# Patient Record
Sex: Female | Born: 1935 | Race: Black or African American | Hispanic: No | Marital: Married | Smoking: Former smoker
Health system: Southern US, Community
[De-identification: ages and names within clinical notes are randomized; demographics above are authoritative.]

## PROBLEM LIST (undated history)

## (undated) DIAGNOSIS — C801 Malignant (primary) neoplasm, unspecified: Secondary | ICD-10-CM

## (undated) DIAGNOSIS — M1990 Unspecified osteoarthritis, unspecified site: Secondary | ICD-10-CM

## (undated) DIAGNOSIS — J45909 Unspecified asthma, uncomplicated: Secondary | ICD-10-CM

## (undated) DIAGNOSIS — M4800 Spinal stenosis, site unspecified: Secondary | ICD-10-CM

## (undated) HISTORY — PX: COLON SURGERY: SHX602

---

## 1998-04-27 ENCOUNTER — Emergency Department (HOSPITAL_COMMUNITY): Admission: EM | Admit: 1998-04-27 | Discharge: 1998-04-27 | Payer: Self-pay | Admitting: Emergency Medicine

## 1998-08-15 ENCOUNTER — Encounter: Payer: Self-pay | Admitting: Emergency Medicine

## 1998-08-15 ENCOUNTER — Emergency Department (HOSPITAL_COMMUNITY): Admission: EM | Admit: 1998-08-15 | Discharge: 1998-08-15 | Payer: Self-pay | Admitting: Emergency Medicine

## 1999-02-27 ENCOUNTER — Emergency Department (HOSPITAL_COMMUNITY): Admission: EM | Admit: 1999-02-27 | Discharge: 1999-02-27 | Payer: Self-pay | Admitting: Emergency Medicine

## 1999-11-27 ENCOUNTER — Emergency Department (HOSPITAL_COMMUNITY): Admission: EM | Admit: 1999-11-27 | Discharge: 1999-11-27 | Payer: Self-pay | Admitting: Emergency Medicine

## 2007-04-08 ENCOUNTER — Encounter: Payer: Self-pay | Admitting: Emergency Medicine

## 2007-04-08 ENCOUNTER — Encounter (INDEPENDENT_AMBULATORY_CARE_PROVIDER_SITE_OTHER): Payer: Self-pay | Admitting: Emergency Medicine

## 2007-04-09 ENCOUNTER — Inpatient Hospital Stay (HOSPITAL_COMMUNITY): Admission: EM | Admit: 2007-04-09 | Discharge: 2007-04-10 | Payer: Self-pay | Admitting: Internal Medicine

## 2007-04-09 ENCOUNTER — Encounter (INDEPENDENT_AMBULATORY_CARE_PROVIDER_SITE_OTHER): Payer: Self-pay | Admitting: Internal Medicine

## 2011-04-04 NOTE — H&P (Signed)
NAME:  Jill [name], Jill [name] NO.:  [id number]   MEDICAL RECORD NO.:  [id number]          PATIENT TYPE:  EMS   LOCATION:  ED                           FACILITY:  [facility]   PHYSICIAN:  Wilson Singer, M.D.DATE OF BIRTH:  [birth year]   DATE OF ADMISSION:  04/08/2007  DATE OF DISCHARGE:                              HISTORY & PHYSICAL   Primary care physician is Dr. Ivory Broad.   HISTORY:  This is a 77 year old lady who has a history of hypertension,  who now presents with a 45-minute history of left leg weakness and  dragging of that left leg earlier this morning.  She also gives a 1-week  history of bitemporal/frontal headache with bilateral earache.  She does  have a background history of hypertension, as mentioned above.  She  alerted her daughter to her left leg weakness this morning and she was  then brought to the emergency room.  She has had no speech problems or  any other limb weakness problem.   PAST MEDICAL HISTORY:  1. Hypertension.  2. Asthma.  3. History of colon cancer.   PAST SURGICAL HISTORY:  1. Colon resection and hysterectomy about 15 years ago for colon      cancer.  She did not have any other adjuvant treatment such as      radiotherapy or chemotherapy.  2. Cesarean section twice.   SOCIAL HISTORY:  She has been married for 35 years in her second  marriage.  She does not smoke and does not drink alcohol.  She is  retired.   MEDICATIONS:  1. Hyzaar 50/12.5 mg one tablet daily.  2. Singulair 10 mg daily.   ALLERGIES:  CODEINE.   FAMILY HISTORY:  Noncontributory.   REVIEW OF SYSTEMS:  Apart from the symptom mentioned above, there are no  other symptoms referable to all systems reviewed.   PHYSICAL EXAMINATION:  VITAL SIGNS:  Temperature 98.5, blood pressure  130/73, pulse 71, saturation 98%.  CARDIOVASCULAR:  Heart sounds are present and normal without murmurs.  There were no carotid bruits.  Jugular venous pressure is not raised.  RESPIRATORY:  Lung fields are clear with occasional wheezing throughout  lung fields.  ABDOMEN:  Soft and nontender with no hepatosplenomegaly.  NEUROLOGIC:  She is alert and oriented and there are absolutely no focal  neurologic signs at the present time.  In particular, there is no  weakness of the left leg.   INVESTIGATIONS:  CT brain scan showed mild diffuse cerebellar atrophy  but no acute changes.  Sodium 140, potassium 3.7, chloride 105,  bicarbonate 26, glucose 137, BUN 9, creatinine 0.88.  Hemoglobin 13.4,  white blood cell count 4.8, platelets 314.   IMPRESSION:  1. Transient ischemic attack.  2. Hypertension.  3. Asthma.   PLAN:  1. Admit.  2. MRI of the brain with MRA.  Bilateral carotid Dopplers.  3. Aspirin.  4. Neurology consult.  5. Control hypertension.   Further recommendations will depend on the patient's hospital progress.      Wilson Singer, M.D.  Electronically Signed     NCG/MEDQ  D:  04/08/2007  T:  04/09/2007  Job:  [id number]   cc:   Dr. Ivory Broad

## 2011-04-04 NOTE — Consult Note (Signed)
NAME:  Jill, [name] NO.:  [id number]   MEDICAL RECORD NO.:  [id number]          PATIENT TYPE:  INP   LOCATION:  0102                         FACILITY:  [facility]   PHYSICIAN:  Deanna Artis. Hickling, M.D.DATE OF BIRTH:  10/25/1928   DATE OF CONSULTATION:  DATE OF DISCHARGE:                                 CONSULTATION   CHIEF COMPLAINT:  Right brain TIA.   HISTORY OF PRESENT CONDITION:  [name] is a 77 year old woman  with a longstanding history of hypertension and no prior history of  stroke.  The patient denies risk factors for stroke including diabetes,  dyslipidemia, heart disease, smoking, or a family history of stroke.   Today she awakened, had her breakfast and as she was getting up she  noted that she was dragging her left foot.  She sat back down and tried  it again, and again was unsteady on her foot.  No she felt weak all  over, something that she had experienced a couple of weeks before  without the localized abnormality.  She had seen Dr. Ivory Broad who had noted  that she had hypertension at that time.  He adjusted her medication.  There are days when she takes it when she feels very lightheaded, other  days when she does not have symptoms from it.   The patient, as best she knows, has been well-hydrated.   REVIEW OF SYSTEMS:  The patient has not had any fever, runny nose,  cough, shortness of breath, chest pain, dysuria, hematuria, nausea,  vomiting, diarrhea, constipation.  She does have arthritis.  She has had  no problems with diabetes or thyroid disease.  She has no known  allergies.  No fractures or sprains.  No depression or anxiety.  She has  not had diplopia, dysarthria, dysphagia, tinnitus, syncope, vertigo,  weakness, numbness, tingling, or loss of bowel and bladder control.  A  12-system review is otherwise negative.   CURRENT MEDICATIONS:  Hyzaar 50 mg daily, Singulair 5 mg daily, Flex  Support 1daily, multivitamin daily, and a  breathing vitamin.  I do not  know what that means.   DRUG ALLERGIES:  CODEINE.   FAMILY HISTORY:  Negative for strokes.  Positive for hypertension and  heart disease.  Her daughter has breast cancer.   SOCIAL HISTORY:  The patient is widowed.  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  She went to  nursing school but did not completed it.  She has traveled back to her  home in [place] and is now her daughter is living with her, and  she provides care for her daughter.  She is fully independent in all of  her activities of daily living.   On arrival at [facility], no definite abnormalities were seen.  CT scan of the brain showed atrophy.  I recommended an MRI scan of the  brain which showed evidence of diffuse subcortical white matter disease  and pontine white matter disease.  I recommended also a 2-D  echocardiogram and carotid Doppler which have not been done.  I told  the  ER physician that the patient was transferred to [facility]  that I would admit the patient, and if not that we would consult.  The  patient remains at [facility] and I have come to consult.  Dr. Karilyn Cota  has admitted the patient earlier today.   Other laboratory studies include sodium 140, potassium 3.7, chloride  105, bicarbonate 26, glucose 137, BUN 9, creatinine 0.8, hemoglobin  13.4, white count 4800, platelet count 314,000.  EKG showed a normal  sinus rhythm, possible anterior infarct of undetermined age.  This was  the only change from a November 27, 1999 EKG.   PHYSICAL EXAMINATION:  GENERAL:  Today this is a well-developed, well-  nourished woman in no acute distress.  Temperature 97.9, blood pressure 120/60, resting pulse 73, respirations  16, oxygen saturation 97%.  EARS, NOSE AND THROAT:  No infection.  Supple neck.  No bruits.  LUNGS:  Clear.  HEART:  No murmurs.  Pulses normal.  ABDOMEN:  Soft.  Bowel sounds normal.  No  hepatosplenomegaly.  EXTREMITIES:  Normal.  NEUROLOGIC EXAMINATION:  Awake, alert.  No dysphasia, dyspraxia.  Cranial nerves round and reactive pupils.  Fundi normal.  Visual fields  full to double simultaneous stimuli.  Symmetric facial strength.  Midline tongue and uvula.  Air conduction greater than bone conduction  bilaterally.  MOTOR EXAMINATION:  Normal strength.  Fine motor movements were normal.  Sensory examination shows a mild peripheral neuropathy.  Good  stereognosis.  Deep tendon reflexes are diminished to absent.  The  patient had bilateral flexor plantar responses.  Her gait is stable with  no hemiparetic gait.   IMPRESSION:  1. Transient ischemic attack right brain, 435.8.  2. Diffuse subcortical and pontine white matter disease.  3. Cortical atrophy.  4. No acute infarction.  5. Diffuse intracranial large vessel disease without occlusion.  6. Hypertension 404.10.   PLAN:  Admit to the [facility] service.  I will follow her unless she is  transferred to [facility], in which the stroke service will follow her.  She needs a 2-D echocardiogram, carotid Doppler as an inpatient,  transcranial Doppler as an inpatient at [facility] or as an outpatient if not.  Enteric-coated aspirin 325 mg daily.  Morning laboratories: fasting  lipid panel, hemoglobin A1c, and serum homocystine.  Risk factors for  stroke should be treated.      Deanna Artis. Sharene Skeans, M.D.  Electronically Signed     WHH/MEDQ  D:  04/08/2007  T:  04/09/2007  Job:  [id number]   cc:   Nolon Nations, MD  [place], [place]   Wilson Singer, M.D.  Fax: [phone]

## 2016-08-20 ENCOUNTER — Emergency Department (HOSPITAL_COMMUNITY): Payer: Medicare Other

## 2016-08-20 ENCOUNTER — Emergency Department (HOSPITAL_COMMUNITY)
Admission: EM | Admit: 2016-08-20 | Discharge: 2016-08-20 | Disposition: A | Discharge status: Home / Self Care | Payer: Medicare Other | Attending: Emergency Medicine | Admitting: Emergency Medicine

## 2016-08-20 ENCOUNTER — Encounter (HOSPITAL_COMMUNITY): Payer: Self-pay

## 2016-08-20 DIAGNOSIS — J45909 Unspecified asthma, uncomplicated: Secondary | ICD-10-CM | POA: Insufficient documentation

## 2016-08-20 DIAGNOSIS — Z87891 Personal history of nicotine dependence: Secondary | ICD-10-CM | POA: Diagnosis not present

## 2016-08-20 DIAGNOSIS — Z85038 Personal history of other malignant neoplasm of large intestine: Secondary | ICD-10-CM | POA: Insufficient documentation

## 2016-08-20 DIAGNOSIS — R05 Cough: Secondary | ICD-10-CM | POA: Insufficient documentation

## 2016-08-20 DIAGNOSIS — Z7982 Long term (current) use of aspirin: Secondary | ICD-10-CM | POA: Diagnosis not present

## 2016-08-20 DIAGNOSIS — R059 Cough, unspecified: Secondary | ICD-10-CM

## 2016-08-20 HISTORY — DX: Unspecified osteoarthritis, unspecified site: M19.90

## 2016-08-20 HISTORY — DX: Spinal stenosis, site unspecified: M48.00

## 2016-08-20 HISTORY — DX: Malignant (primary) neoplasm, unspecified: C80.1

## 2016-08-20 HISTORY — DX: Unspecified asthma, uncomplicated: J45.909

## 2016-08-20 LAB — CBC WITH DIFFERENTIAL/PLATELET
Basophils Absolute: 0.1 10*3/uL (ref 0.0–0.1)
Basophils Relative: 1 %
EOS PCT: 4 %
Eosinophils Absolute: 0.3 10*3/uL (ref 0.0–0.7)
HEMATOCRIT: 39.6 % (ref 36.0–46.0)
Hemoglobin: 13.3 g/dL (ref 12.0–15.0)
LYMPHS ABS: 3.1 10*3/uL (ref 0.7–4.0)
LYMPHS PCT: 36 %
MCH: 28.5 pg (ref 26.0–34.0)
MCHC: 33.6 g/dL (ref 30.0–36.0)
MCV: 85 fL (ref 78.0–100.0)
MONO ABS: 0.6 10*3/uL (ref 0.1–1.0)
Monocytes Relative: 7 %
NEUTROS ABS: 4.4 10*3/uL (ref 1.7–7.7)
Neutrophils Relative %: 52 %
PLATELETS: 347 10*3/uL (ref 150–400)
RBC: 4.66 MIL/uL (ref 3.87–5.11)
RDW: 15.9 % — AB (ref 11.5–15.5)
WBC: 8.4 10*3/uL (ref 4.0–10.5)

## 2016-08-20 LAB — BASIC METABOLIC PANEL
Anion gap: 9 (ref 5–15)
BUN: 7 mg/dL (ref 6–20)
CHLORIDE: 105 mmol/L (ref 101–111)
CO2: 25 mmol/L (ref 22–32)
Calcium: 9.3 mg/dL (ref 8.9–10.3)
Creatinine, Ser: 0.69 mg/dL (ref 0.44–1.00)
GFR calc Af Amer: >60 mL/min (ref >60)
GFR calc non Af Amer: >60 mL/min (ref >60)
GLUCOSE: 99 mg/dL (ref 65–99)
POTASSIUM: 3.9 mmol/L (ref 3.5–5.1)
Sodium: 139 mmol/L (ref 135–145)

## 2016-08-20 MED ORDER — PREDNISONE 10 MG PO TABS: 40.0000 mg | ORAL_TABLET | Freq: Every day | ORAL | 0 refills | Status: AC

## 2016-08-20 MED ORDER — ALBUTEROL SULFATE HFA 108 (90 BASE) MCG/ACT IN AERS: 2.0000 | INHALATION_SPRAY | RESPIRATORY_TRACT | 1 refills | Status: AC | PRN

## 2016-08-20 MED ORDER — IPRATROPIUM-ALBUTEROL 0.5-2.5 (3) MG/3ML IN SOLN
3.0000 mL | Freq: Once | RESPIRATORY_TRACT | Status: AC
  Administered 2016-08-20: 3 mL via RESPIRATORY_TRACT
  Filled 2016-08-20: qty 3

## 2016-08-20 MED ORDER — PREDNISONE 20 MG PO TABS
40.0000 mg | ORAL_TABLET | Freq: Once | ORAL | Status: AC
  Administered 2016-08-20: 40 mg via ORAL
  Filled 2016-08-20: qty 2

## 2016-08-20 MED ORDER — BENZONATATE 100 MG PO CAPS: 100.0000 mg | ORAL_CAPSULE | Freq: Three times a day (TID) | ORAL | 0 refills | Status: AC

## 2016-08-20 NOTE — Discharge Instructions (Signed)
Follow-up with your primary care provider and the pulmonologist this week to be seen as soon as possible for follow-up. Take the prednisone as prescribed. Use your inhaler as needed for wheezing and cough.  Return to the emergency department if you experience chest pain, difficulty breathing, shortness of breath, coughing up bloody sputum, fever, headaches, or any other concerning symptoms.

## 2016-08-20 NOTE — ED Triage Notes (Signed)
Pt c/o increasing productive cough and congestion x 1 month.  Denies new pain.  Pt has been taking OTC medications w/o relief.  NAD noted.  Pt easily speaking full sentences.

## 2016-08-20 NOTE — ED Notes (Signed)
Pt has had a cough for the last month but last night she was coughing so bad her daughter wanted to bring her in to make sure it wasn't pneumonia.

## 2016-08-20 NOTE — ED Provider Notes (Signed)
[facility] DEPT Provider Note   CSN: [id number] Arrival date & time: 08/20/16  1018     History   Chief Complaint Chief Complaint  Patient presents with  . Cough  . Chest Congestion    HPI Jill [name] is a 82 y.o. female.  HPI   Patient Is an 82 year old female with history of asthma, colon cancer, chronic back pain who presents the department with progressively worsening cough for 1 month. Productive cough with clear mucus worsening in the last week. The cough kept the pt from sleeping last night. She has tried her albuterol inhaler which helps a little. She is also tried allergy medicine and cough drops without relief. Patient is not a smoker. Associated symptoms cold/hot intervals. No chest pain or shortness of breath and wheezing. Patient denies abdominal pain, chest pain, headache, dizziness, syncope, fever.  Past Medical History:  Diagnosis Date  . Arthritis   . Asthma   . Cancer ([place])    colon  . Spinal stenosis     There are no active problems to display for this patient.   Past Surgical History:  Procedure Laterality Date  . CESAREAN SECTION    . COLON SURGERY      OB History    No data available       Home Medications    Prior to Admission medications   Medication Sig Start Date End Date Taking? Authorizing Provider  amLODipine (NORVASC) 10 MG tablet Take 10 mg by mouth daily.   Yes Historical Provider, MD  aspirin buffered ([employer] BUFFERED ASPIRIN) 325 MG TABS tablet Take 325 mg by mouth daily.    Yes Historical Provider, MD  Cetirizine HCl 10 MG CAPS Take 10 mg by mouth daily.  06/19/11  Yes Historical Provider, MD  Cholecalciferol (VITAMIN D-1000 MAX ST) 1000 units tablet Take 1,000 Units by mouth daily.    Yes Historical Provider, MD  Flaxseed, Linseed, (FLAXSEED OIL MAX STR) 1300 MG CAPS Take 1,300 mg by mouth daily.    Yes Historical Provider, MD  gabapentin (NEURONTIN) 300 MG capsule Take 300 mg by mouth 3 (three) times daily.   02/23/16  Yes Historical Provider, MD  hydroxypropyl methylcellulose / hypromellose (ISOPTO TEARS / GONIOVISC) 2.5 % ophthalmic solution Place 1 drop into both eyes 3 (three) times daily as needed for dry eyes.   Yes Historical Provider, MD  ibuprofen (ADVIL,MOTRIN) 200 MG tablet Take 600 mg by mouth every 6 (six) hours as needed for moderate pain.   Yes Historical Provider, MD  losartan (COZAAR) 100 MG tablet Take 100 mg by mouth daily.   Yes Historical Provider, MD  Multiple Vitamin (MULTIVITAMIN WITH MINERALS) TABS tablet Take 1 tablet by mouth daily.   Yes Historical Provider, MD  OVER THE COUNTER MEDICATION Take 1 capsule by mouth daily. Lung Supplement   Yes Historical Provider, MD  OVER THE COUNTER MEDICATION Take 1 capsule by mouth daily. Flex Support Supplement   Yes Historical Provider, MD  vitamin C (ASCORBIC ACID) 500 MG tablet Take 1,000 mg by mouth daily.    Yes Historical Provider, MD  albuterol (PROVENTIL HFA;VENTOLIN HFA) 108 (90 Base) MCG/ACT inhaler Inhale 2 puffs into the lungs every 4 (four) hours as needed for wheezing or shortness of breath. 08/20/16   Kalman Drape, PA  benzonatate (TESSALON) 100 MG capsule Take 1 capsule (100 mg total) by mouth every 8 (eight) hours. 08/20/16   Kalman Drape, PA  predniSONE (DELTASONE) 10 MG tablet Take 4 tablets (40  mg total) by mouth daily. 08/20/16 08/24/16  Kalman Drape, PA    Family History History reviewed. No pertinent family history.  Social History Social History  Substance Use Topics  . Smoking status: Former Research scientist (life sciences)  . Smokeless tobacco: Never Used  . Alcohol use No     Allergies   Morphine   Review of Systems Review of Systems  Constitutional: Positive for chills. Negative for fever.  HENT: Negative for congestion, postnasal drip, rhinorrhea, sore throat and trouble swallowing.   Eyes: Negative for visual disturbance.  Respiratory: Positive for cough. Negative for shortness of breath.   Cardiovascular: Negative  for chest pain.  Gastrointestinal: Negative for abdominal pain, nausea and vomiting.  Genitourinary: Negative for dysuria and hematuria.  Musculoskeletal: Positive for back pain (chronic).  Skin: Negative for rash.  Neurological: Negative for dizziness, syncope and headaches.     Physical Exam Updated Vital Signs BP 147/96 (BP Location: Right Arm)   Pulse 99   Temp 98.2 F (36.8 C) (Oral)   Ht 4\' 9"  (1.448 m)   Wt 68.9 kg   SpO2 100%   BMI 32.89 kg/m   Physical Exam  Constitutional: She appears well-developed and well-nourished. No distress.  HENT:  Head: Normocephalic and atraumatic.  Mouth/Throat: Uvula is midline and mucous membranes are normal. Posterior oropharyngeal erythema present. No oropharyngeal exudate, posterior oropharyngeal edema or tonsillar abscesses.  Eyes: Conjunctivae are normal.  Cardiovascular: Normal rate, regular rhythm and normal heart sounds.  Exam reveals no gallop and no friction rub.   No murmur heard. Pulses:      Radial pulses are 2+ on the right side, and 2+ on the left side.  Pulmonary/Chest: Effort normal and breath sounds normal. No respiratory distress. She has no decreased breath sounds. She has no wheezes. She has no rhonchi. She has no rales.  Abdominal: Soft. There is no tenderness.  Musculoskeletal: Normal range of motion. She exhibits no edema.  Neurological: She is alert. Coordination normal.  Skin: Skin is warm and dry. She is not diaphoretic.  Psychiatric: She has a normal mood and affect. Her behavior is normal.  Nursing note and vitals reviewed.    ED Treatments / Results  Labs (all labs ordered are listed, but only abnormal results are displayed) Labs Reviewed  CBC WITH DIFFERENTIAL/PLATELET - Abnormal; Notable for the following:       Result Value   RDW 15.9 (*)    All other components within normal limits  BASIC METABOLIC PANEL    EKG  EKG Interpretation None       Radiology Dg Chest 2 View  Result Date:  08/20/2016 CLINICAL DATA:  Productive cough x approx. 1 month, worsening last p.m. H/o Asthma. Denies chest pain. EXAM: CHEST  2 VIEW COMPARISON:  None. FINDINGS: Lordotic exam. Normal cardiac silhouette. No effusion, infiltrate or pneumothorax. Degenerate changes of the shoulders. IMPRESSION: No acute cardiopulmonary process. Electronically Signed   By: Suzy Bouchard M.D.   On: 08/20/2016 12:07    Procedures Procedures (including critical care time)  Medications Ordered in ED Medications  ipratropium-albuterol (DUONEB) 0.5-2.5 (3) MG/3ML nebulizer solution 3 mL (3 mLs Nebulization Given 08/20/16 1228)  predniSONE (DELTASONE) tablet 40 mg (40 mg Oral Given 08/20/16 1307)     Initial Impression / Assessment and Plan / ED Course  I have reviewed the triage vital signs and the nursing notes.  Pertinent labs & imaging results that were available during my care of the patient were reviewed by me and  considered in my medical decision making (see chart for details).  Clinical Course   12:00 PM PT states the DUO neb caused her to cough more. Discussed out patient f/u with pulmonology. Pt amenable to plan.  Pt with cough, afebrile, lungs CTA, no wheezes, labs unremarkable, no leukocytosis. Chest x-ray reviewed by me revealed no infiltrates or acute abnormalities. Patient's cough is likely 2/2 her asthma and allergies and less likely an infectious process such as pneumonia. Patient was given neb treatment in the ED which caused increased coughing. Patient will be discharged with a short course of prednisone and albuterol inhaler. Instructed patient to follow-up with her PCP and pulmonologist this week to be reevaluated. Discussed strict return precautions to the ED. I discussed all of the results with the patient and family members and they have expressed their understanding to the verbal discharge instructions.  Pt case discussed and pt seen by Dr. Venora Maples who agrees with the above plan.   Final  Clinical Impressions(s) / ED Diagnoses   Final diagnoses:  Cough    New Prescriptions Discharge Medication List as of 08/20/2016  1:56 PM    START taking these medications   Details  benzonatate (TESSALON) 100 MG capsule Take 1 capsule (100 mg total) by mouth every 8 (eight) hours., Starting Sun 08/20/2016, Print    predniSONE (DELTASONE) 10 MG tablet Take 4 tablets (40 mg total) by mouth daily., Starting Sun 08/20/2016, Until Thu 08/24/2016, [place], PA 08/20/16 1517    Jola Schmidt, MD 08/20/16 [phone]

## 2016-08-20 NOTE — ED Notes (Signed)
Bed: WA02 Expected date:  Expected time:  Means of arrival:  Comments: 

## 2018-02-18 ENCOUNTER — Other Ambulatory Visit: Payer: Self-pay

## 2018-02-18 ENCOUNTER — Emergency Department (HOSPITAL_COMMUNITY)
Admission: EM | Admit: 2018-02-18 | Discharge: 2018-02-18 | Disposition: A | Discharge status: Home / Self Care | Payer: Medicare Other | Attending: Emergency Medicine | Admitting: Emergency Medicine

## 2018-02-18 ENCOUNTER — Emergency Department (HOSPITAL_COMMUNITY): Payer: Medicare Other

## 2018-02-18 ENCOUNTER — Encounter (HOSPITAL_COMMUNITY): Payer: Self-pay

## 2018-02-18 DIAGNOSIS — Y92000 Kitchen of unspecified non-institutional (private) residence as  the place of occurrence of the external cause: Secondary | ICD-10-CM | POA: Diagnosis not present

## 2018-02-18 DIAGNOSIS — Y999 Unspecified external cause status: Secondary | ICD-10-CM | POA: Diagnosis not present

## 2018-02-18 DIAGNOSIS — Z79899 Other long term (current) drug therapy: Secondary | ICD-10-CM | POA: Diagnosis not present

## 2018-02-18 DIAGNOSIS — R112 Nausea with vomiting, unspecified: Secondary | ICD-10-CM | POA: Diagnosis not present

## 2018-02-18 DIAGNOSIS — Z85038 Personal history of other malignant neoplasm of large intestine: Secondary | ICD-10-CM | POA: Diagnosis not present

## 2018-02-18 DIAGNOSIS — S4992XA Unspecified injury of left shoulder and upper arm, initial encounter: Secondary | ICD-10-CM | POA: Diagnosis present

## 2018-02-18 DIAGNOSIS — W1830XA Fall on same level, unspecified, initial encounter: Secondary | ICD-10-CM | POA: Diagnosis not present

## 2018-02-18 DIAGNOSIS — M25512 Pain in left shoulder: Secondary | ICD-10-CM | POA: Diagnosis not present

## 2018-02-18 DIAGNOSIS — Z87891 Personal history of nicotine dependence: Secondary | ICD-10-CM | POA: Insufficient documentation

## 2018-02-18 DIAGNOSIS — Y939 Activity, unspecified: Secondary | ICD-10-CM | POA: Insufficient documentation

## 2018-02-18 MED ORDER — ACETAMINOPHEN 325 MG PO TABS: 650.0000 mg | ORAL_TABLET | Freq: Four times a day (QID) | ORAL | 0 refills | Status: AC | PRN

## 2018-02-18 MED ORDER — ONDANSETRON HCL 4 MG PO TABS: 4.0000 mg | ORAL_TABLET | Freq: Three times a day (TID) | ORAL | 0 refills | Status: AC | PRN

## 2018-02-18 MED ORDER — ACETAMINOPHEN 325 MG PO TABS
650.0000 mg | ORAL_TABLET | Freq: Once | ORAL | Status: AC
  Administered 2018-02-18: 650 mg via ORAL
  Filled 2018-02-18: qty 2

## 2018-02-18 NOTE — ED Provider Notes (Signed)
[facility] DEPT Provider Note   CSN: [id number] Arrival date & time: 02/18/18  1201     History   Chief Complaint Chief Complaint  Patient presents with  . Fall  . Shoulder Pain  . Emesis    HPI Jill [name] is a 84 y.o. female.  HPI   Patient is an 84 year old female with a h/o spinal stenosis and HTN who presents the ED today complaining of 8/10 constant left-sided shoulder pain that began yesterday. Pt states she fell 2 days ago onto the left shoulder after she tripped over the door of the dishwasher. Was unable to catch herself with her hands. States fall was purely mechanical and she had No prodrome of chest pain, shortness of breath, lightheadedness prior to the fall. Denies that she hit her head or loss consciousness.  Denies any neck pain, back pain, abdominal pain.  Denies any headaches, vision changes, lightheadedness, weakness, numbness.  Denies any chest pain, palpitations, or shortness of breath currently.  States she takes gabapentin for pain which has not improved her symptoms.  Also tried taking ibuprofen this morning on empty stomach, this led to 2 episodes of vomiting shortly after taking ibuprofen.  Has had no continued episodes of vomiting and denies any nausea currently.  Denies any other injuries or pain since the fall.  Daughter at bedside states that pt is at baseline and has not been confused or had any changes in mental status recently. Denies that pt has had any other complaints at home.  Past Medical History:  Diagnosis Date  . Arthritis   . Asthma   . Cancer ([place])    colon  . Spinal stenosis     There are no active problems to display for this patient.   Past Surgical History:  Procedure Laterality Date  . CESAREAN SECTION    . COLON SURGERY       OB History   None      Home Medications    Prior to Admission medications   Medication Sig Start Date End Date Taking? Authorizing Provider    albuterol (PROVENTIL HFA;VENTOLIN HFA) 108 (90 Base) MCG/ACT inhaler Inhale 2 puffs into the lungs every 4 (four) hours as needed for wheezing or shortness of breath. 08/20/16  Yes Focht, Jessica L, PA  amLODipine (NORVASC) 10 MG tablet Take 10 mg by mouth daily.   Yes [provider]  aspirin buffered ([employer] BUFFERED ASPIRIN) 325 MG TABS tablet Take 325 mg by mouth daily.    Yes [provider]  Cetirizine HCl 10 MG CAPS Take 10 mg by mouth daily.  06/19/11  Yes [provider]  Cholecalciferol (VITAMIN D-1000 MAX ST) 1000 units tablet Take 1,000 Units by mouth daily.    Yes [provider]  Flaxseed, Linseed, (FLAXSEED OIL MAX STR) 1300 MG CAPS Take 1,300 mg by mouth daily.    Yes [provider]  gabapentin (NEURONTIN) 300 MG capsule Take 300 mg by mouth 2 (two) times daily.  02/23/16  Yes [provider]  hydroxypropyl methylcellulose / hypromellose (ISOPTO TEARS / GONIOVISC) 2.5 % ophthalmic solution Place 1 drop into both eyes at bedtime.    Yes [provider]  ibuprofen (ADVIL,MOTRIN) 200 MG tablet Take 600 mg by mouth every 6 (six) hours as needed for moderate pain.   Yes [provider]  losartan (COZAAR) 100 MG tablet Take 100 mg by mouth daily.   Yes [provider]  Multiple Vitamin (MULTIVITAMIN WITH MINERALS)  TABS tablet Take 1 tablet by mouth daily.   Yes [provider]  OVER THE COUNTER MEDICATION Take 1 capsule by mouth daily. Lung Supplement   Yes [provider]  OVER THE COUNTER MEDICATION Take 1 capsule by mouth daily. Flex Support Supplement   Yes [provider]  vitamin C (ASCORBIC ACID) 500 MG tablet Take 1,000 mg by mouth daily.    Yes [provider]  acetaminophen (TYLENOL) 325 MG tablet Take 2 tablets (650 mg total) by mouth every 6 (six) hours as needed. Do not take more than 4000mg  of tylenol per day 02/18/18   Sherise Geerdes S, PA-C  benzonatate (TESSALON)  100 MG capsule Take 1 capsule (100 mg total) by mouth every 8 (eight) hours. Patient not taking: Reported on 02/18/2018 08/20/16   Jackson Latino L, PA  ondansetron (ZOFRAN) 4 MG tablet Take 1 tablet (4 mg total) by mouth every 8 (eight) hours as needed for nausea or vomiting. 02/18/18   Coleta Grosshans S, PA-C    Family History No family history on file.  Social History Social History   Tobacco Use  . Smoking status: Former Research scientist (life sciences)  . Smokeless tobacco: Never Used  Substance Use Topics  . Alcohol use: No  . Drug use: No     Allergies   Morphine   Review of Systems Review of Systems  Constitutional: Negative for chills and fever.  HENT: Negative for congestion and ear pain.   Eyes: Negative for pain and visual disturbance.  Respiratory: Negative for shortness of breath.   Cardiovascular: Negative for chest pain, palpitations and leg swelling.  Gastrointestinal: Positive for vomiting. Negative for abdominal pain, constipation, diarrhea and nausea.  Genitourinary: Negative for dysuria, flank pain, frequency, hematuria and urgency.  Musculoskeletal: Negative for back pain and neck pain.       Left shoulder pain  Skin: Negative for rash.  Neurological: Negative for dizziness, syncope, weakness, light-headedness, numbness and headaches.  All other systems reviewed and are negative.    Physical Exam Updated Vital Signs BP (!) 165/82 (BP Location: Right Arm)   Pulse (!) 114   Temp 98.3 F (36.8 C) (Oral)   Resp 20   Ht 4\' 3"  (1.295 m)   Wt 70.3 kg (155 lb)   SpO2 98%   BMI 41.90 kg/m   Physical Exam  Constitutional: She is oriented to person, place, and time. She appears well-developed and well-nourished. No distress.  HENT:  Head: Normocephalic and atraumatic.  Eyes: Conjunctivae are normal.  Neck: Neck supple.  Cardiovascular: Normal rate and regular rhythm.  No murmur heard. Pulmonary/Chest: Effort normal and breath sounds normal. No respiratory distress.    Abdominal: Soft. There is no tenderness.  Musculoskeletal: She exhibits no edema.  Decreased strength to left upper extremity due to pain. 5/5 grip strength, flexion/extension at elbow. Decreased ability to flex, abduct LUE due to pain. TTP diffusely to the left shoulder and proximal humerus. No erythema, warmth or swelling. Radial pulses intact. Sensation intact bilat and symmetric. No TTP to the cervical, thoracic, or lumbar spine. No pain to the paraspinous muscles.  Neurological: She is alert and oriented to person, place, and time.  Skin: Skin is warm and dry. Capillary refill takes less than 2 seconds.  Psychiatric: She has a normal mood and affect.  Nursing note and vitals reviewed.   ED Treatments / Results  Labs (all labs ordered are listed, but only abnormal results are displayed) Labs Reviewed - No data to display  EKG None  Radiology Dg Shoulder Left  Result Date: 02/18/2018 CLINICAL DATA:  Tripped in the home 2 days ago with left shoulder pain. EXAM: LEFT SHOULDER - 2+ VIEW COMPARISON:  None. FINDINGS: There is osteoarthritis of the glenohumeral joint but the humeral head is normally located. There is osteoarthritis of the [facility] joint with inferior osteophytes. There is narrowing of the humeral acromial distance. The patient certainly could have acute or chronic rotator cuff pathology. IMPRESSION: No acute radiographic finding. Chronic degenerative changes of the shoulder joint and AC joint. There could be chronic and/or acute rotator cuff pathology. Electronically Signed   By: Nelson Chimes M.D.   On: 02/18/2018 14:06    Procedures Procedures (including critical care time)  Medications Ordered in ED Medications  acetaminophen (TYLENOL) tablet 650 mg (650 mg Oral Given 02/18/18 1502)     Initial Impression / Assessment and Plan / ED Course  I have reviewed the triage vital signs and the nursing notes.  Pertinent labs & imaging results that were available during my care of  the patient were reviewed by me and considered in my medical decision making (see chart for details).    Discussed pt presentation and exam findings with Dr. Lacinda Axon, who agrees with the plan to d/c pt with outpt f/u.   Final Clinical Impressions(s) / ED Diagnoses   Final diagnoses:  Acute pain of left shoulder  Non-intractable vomiting with nausea, unspecified vomiting type   Patient with left shoulder pain after fall 2 days ago.  X-ray negative for acute fracture or dislocation. X-rays suggest that there could be acute or chronic rotator cuff pathology. No evidence of proximal humerus fracture.  Patient is neurovascularly intact.  She does have some decreased strength due to pain. Pain reproducible on exam. No evidence of septic joint.   Advised patient's family to have patient treat her pain with Tylenol, ice, heat, and have her follow-up with orthopedics for further evaluation of her pain as this may be a rotator cuff injury.  Patient denies head trauma or LOC. Daughter states she was present and nearby during fall and also denies head trauma.  Patient is not anticoagulated.  No indication for head ct at this time. Pt stable for d/c with outpt f/u.   Pt htn in the ED. She did not take her BP medications today. Doubt htn emergency/urgency as pt is asx. Advised to take bp medications at home. Pt also has some tachycardia. No chest pain or SOB. No LE swelling, erythema, or pain. Does not appear grossly dehydrated on exam, but may have some mild dehydration given h/o vomiting x2 today. Also appears that pt was tachycardic during her last visit in the ED. She does not appear to have any source of infection and I do not suspect any emergent underlying pathology to cause this. Advised her to push fluids at home and gave rx for zofran at home. Suspect episodes of vomiting earlier due to taking ibuprofen on empty stomach, no continued episodes of vomiting.  Pt safe for dispo with outpt f/u with ortho and  PCP. Gave strict return precautions for any new or worsening sxs. All questions answered and pt and her daughter understand plan.  ED Discharge Orders        Ordered    ondansetron (ZOFRAN) 4 MG tablet  Every 8 hours PRN     02/18/18 1508    acetaminophen (TYLENOL) 325 MG tablet  Every 6 hours PRN     02/18/18 1508  Rodney Booze, PA-C 02/19/18 0035    Nat Christen, MD 02/22/18 1339

## 2018-02-18 NOTE — Discharge Instructions (Signed)
The xray of your left shoulder did not show any evidence of a broken bone or dislocated shoulder.  You were given a referral to follow-up with the orthopedic doctor for further treatment of your left shoulder pain.  You will need to call the office to schedule an appointment.  You may take Tylenol and over the counter pain medicines for your symptoms.  You may also use cold or warm compresses to help ease your pain.  I have attached shoulder range of motion exercises as well.  You were also given a prescription for Zofran to help with any nausea that you may have.  Make sure that you are drinking lots of fluids and staying hydrated.  Please return to the emergency department for any new or worsening symptoms including any chest pain, shortness of breath, headaches, or any new or worsening symptoms.

## 2018-02-18 NOTE — ED Triage Notes (Addendum)
Patient tripped on the dishwasher drawer 2 days ago, landing on her left shoulder. Patient did not hit her head or have LOC. Patient had vomiting x 2 this AM.

## 2019-04-20 ENCOUNTER — Emergency Department (HOSPITAL_BASED_OUTPATIENT_CLINIC_OR_DEPARTMENT_OTHER)
Admission: EM | Admit: 2019-04-20 | Discharge: 2019-04-20 | Disposition: A | Discharge status: Home / Self Care | Payer: Medicare Other | Attending: Emergency Medicine | Admitting: Emergency Medicine

## 2019-04-20 ENCOUNTER — Emergency Department (HOSPITAL_BASED_OUTPATIENT_CLINIC_OR_DEPARTMENT_OTHER): Payer: Medicare Other

## 2019-04-20 ENCOUNTER — Encounter (HOSPITAL_BASED_OUTPATIENT_CLINIC_OR_DEPARTMENT_OTHER): Payer: Self-pay | Admitting: Emergency Medicine

## 2019-04-20 ENCOUNTER — Other Ambulatory Visit: Payer: Self-pay

## 2019-04-20 DIAGNOSIS — W06XXXA Fall from bed, initial encounter: Secondary | ICD-10-CM | POA: Insufficient documentation

## 2019-04-20 DIAGNOSIS — M25512 Pain in left shoulder: Secondary | ICD-10-CM | POA: Diagnosis not present

## 2019-04-20 DIAGNOSIS — M542 Cervicalgia: Secondary | ICD-10-CM | POA: Insufficient documentation

## 2019-04-20 DIAGNOSIS — Y929 Unspecified place or not applicable: Secondary | ICD-10-CM | POA: Insufficient documentation

## 2019-04-20 DIAGNOSIS — J45909 Unspecified asthma, uncomplicated: Secondary | ICD-10-CM | POA: Diagnosis not present

## 2019-04-20 DIAGNOSIS — Z87891 Personal history of nicotine dependence: Secondary | ICD-10-CM | POA: Diagnosis not present

## 2019-04-20 DIAGNOSIS — Y999 Unspecified external cause status: Secondary | ICD-10-CM | POA: Insufficient documentation

## 2019-04-20 DIAGNOSIS — S0990XA Unspecified injury of head, initial encounter: Secondary | ICD-10-CM | POA: Diagnosis present

## 2019-04-20 DIAGNOSIS — Z79899 Other long term (current) drug therapy: Secondary | ICD-10-CM | POA: Insufficient documentation

## 2019-04-20 DIAGNOSIS — Z7982 Long term (current) use of aspirin: Secondary | ICD-10-CM | POA: Diagnosis not present

## 2019-04-20 DIAGNOSIS — Y939 Activity, unspecified: Secondary | ICD-10-CM | POA: Insufficient documentation

## 2019-04-20 DIAGNOSIS — W19XXXA Unspecified fall, initial encounter: Secondary | ICD-10-CM

## 2019-04-20 MED ORDER — ACETAMINOPHEN 500 MG PO TABS
1000.0000 mg | ORAL_TABLET | Freq: Once | ORAL | Status: AC
  Administered 2019-04-20: 05:00:00 1000 mg via ORAL
  Filled 2019-04-20: qty 2

## 2019-04-20 NOTE — ED Notes (Signed)
Called Jill [name] and requested transport for patient back to her residence.  Was advised they would have someone out as soon as they could.

## 2019-04-20 NOTE — Discharge Instructions (Addendum)
You may take Tylenol 1000 mg every 6 hours as needed for pain.  CT of your head, neck and x-ray of your left shoulder showed no acute injury today.

## 2019-04-20 NOTE — ED Triage Notes (Signed)
Patient arrived via [facility] c/o fall getting out of bed. Patient ambulates with walker. Patient fell hitting head on nightstand. Patient denies LOC, hit life alert alarm. Patient endorses pain to right of C Spine on back of head. Patient is AO x 4 with VS WDL.

## 2019-04-20 NOTE — ED Provider Notes (Signed)
TIME SEEN: 4:12 AM  CHIEF COMPLAINT: Fall  HPI: Patient is an 85 year old female with history of colon cancer, asthma who presents emergency department after a fall out of bed.  States she was getting up to go to the bathroom and she lost her balance and fell striking her head on the nightstand.  There is no loss of consciousness.  She does take a full dose aspirin regularly but no other antiplatelet or anticoagulation.  Complains of posterior head pain, right-sided neck pain and left shoulder pain.  States the left shoulder pain is chronic but does feel worse than normal.  No numbness, tingling or focal weakness.  No chest pain or shortness of breath.  No abdominal pain.  Patient arrives with EMS.  Patient ambulates with a walker at baseline.  ROS: See HPI Constitutional: no fever  Eyes: no drainage  ENT: no runny nose   Cardiovascular:  no chest pain  Resp: no SOB  GI: no vomiting GU: no dysuria Integumentary: no rash  Allergy: no hives  Musculoskeletal: no leg swelling  Neurological: no slurred speech ROS otherwise negative  PAST MEDICAL HISTORY/PAST SURGICAL HISTORY:  Past Medical History:  Diagnosis Date  . Arthritis   . Asthma   . Cancer ([place])    colon  . Spinal stenosis     MEDICATIONS:  Prior to Admission medications   Medication Sig Start Date End Date Taking? Authorizing Provider  acetaminophen (TYLENOL) 325 MG tablet Take 2 tablets (650 mg total) by mouth every 6 (six) hours as needed. Do not take more than 4000mg  of tylenol per day 02/18/18   Couture, Cortni S, PA-C  albuterol (PROVENTIL HFA;VENTOLIN HFA) 108 (90 Base) MCG/ACT inhaler Inhale 2 puffs into the lungs every 4 (four) hours as needed for wheezing or shortness of breath. 08/20/16   Focht, Fraser Din, PA  amLODipine (NORVASC) 10 MG tablet Take 10 mg by mouth daily.    [provider]  aspirin buffered ([employer] BUFFERED ASPIRIN) 325 MG TABS tablet Take 325 mg by mouth daily.     [provider]   benzonatate (TESSALON) 100 MG capsule Take 1 capsule (100 mg total) by mouth every 8 (eight) hours. Patient not taking: Reported on 02/18/2018 08/20/16   Kalman Drape, PA  Cetirizine HCl 10 MG CAPS Take 10 mg by mouth daily.  06/19/11   [provider]  Cholecalciferol (VITAMIN D-1000 MAX ST) 1000 units tablet Take 1,000 Units by mouth daily.     [provider]  Flaxseed, Linseed, (FLAXSEED OIL MAX STR) 1300 MG CAPS Take 1,300 mg by mouth daily.     [provider]  gabapentin (NEURONTIN) 300 MG capsule Take 300 mg by mouth 2 (two) times daily.  02/23/16   [provider]  hydroxypropyl methylcellulose / hypromellose (ISOPTO TEARS / GONIOVISC) 2.5 % ophthalmic solution Place 1 drop into both eyes at bedtime.     [provider]  ibuprofen (ADVIL,MOTRIN) 200 MG tablet Take 600 mg by mouth every 6 (six) hours as needed for moderate pain.    [provider]  losartan (COZAAR) 100 MG tablet Take 100 mg by mouth daily.    [provider]  Multiple Vitamin (MULTIVITAMIN WITH MINERALS) TABS tablet Take 1 tablet by mouth daily.    [provider]  ondansetron (ZOFRAN) 4 MG tablet Take 1 tablet (4 mg total) by mouth every 8 (eight) hours as needed for nausea or vomiting. 02/18/18   Couture, Cortni S, PA-C  OVER  THE COUNTER MEDICATION Take 1 capsule by mouth daily. Lung Supplement    [provider]  OVER THE COUNTER MEDICATION Take 1 capsule by mouth daily. Flex Support Supplement    [provider]  vitamin C (ASCORBIC ACID) 500 MG tablet Take 1,000 mg by mouth daily.     [provider]    ALLERGIES:  Allergies  Allergen Reactions  . Morphine Other (See Comments)    hallucinations    SOCIAL HISTORY:  Social History   Tobacco Use  . Smoking status: Former Research scientist (life sciences)  . Smokeless tobacco: Never Used  Substance Use Topics  . Alcohol use: No    FAMILY HISTORY: History reviewed. No pertinent family  history.  EXAM: BP (!) 153/80 (BP Location: Right Arm)   Pulse 94   Temp 98.6 F (37 C) (Oral)   Resp 20   Ht 4\' 6"  (1.372 m)   Wt 74.8 kg   SpO2 99%   BMI 39.78 kg/m  CONSTITUTIONAL: Alert and oriented x 4 and responds appropriately to questions. Well-appearing; well-nourished; GCS 15 HEAD: Normocephalic; atraumatic EYES: Conjunctivae clear, PERRL, EOMI ENT: normal nose; no rhinorrhea; moist mucous membranes; pharynx without lesions noted; no dental injury; no septal hematoma NECK: Supple, no meningismus, no LAD; no midline spinal tenderness, step-off or deformity; trachea midline, cervical collar in place CARD: RRR; S1 and S2 appreciated; no murmurs, no clicks, no rubs, no gallops RESP: Normal chest excursion without splinting or tachypnea; breath sounds clear and equal bilaterally; no wheezes, no rhonchi, no rales; no hypoxia or respiratory distress CHEST:  chest wall stable, no crepitus or ecchymosis or deformity, nontender to palpation; no flail chest ABD/GI: Normal bowel sounds; non-distended; soft, non-tender, no rebound, no guarding; no ecchymosis or other lesions noted PELVIS:  stable, nontender to palpation BACK:  The back appears normal and is non-tender to palpation, there is no CVA tenderness; no midline spinal tenderness, step-off or deformity EXT: Tender to palpation diffusely over the left shoulder with decreased range of motion secondary to pain.  No other deformity or tenderness noted to her his extremities on exam.  She has 2+ radial and DP pulses bilaterally.  Compartments are soft.  No ecchymosis, lacerations, abrasions or skin tears. SKIN: Normal color for age and race; warm NEURO: Moves all extremities equally, reports sensation to light touch intact diffusely, no facial asymmetry, normal speech PSYCH: The patient's mood and manner are appropriate. Grooming and personal hygiene are appropriate.  MEDICAL DECISION MAKING: Patient here after a fall out of bed.  Seems  to be mechanical in nature.  Will obtain CT of the head, cervical spine and x-ray of the left shoulder.  She agrees to Tylenol for pain control.  Hemodynamically stable, neurologically intact.  ED PROGRESS: Imaging shows no acute injury.  Cervical collar removed and C-spine cleared clinically.  We will have [facility] come get patient to take her home.  Her family has been updated that she is here in the emergency department.  At this time, I do not feel there is any life-threatening condition present. I have reviewed and discussed all results (EKG, imaging, lab, urine as appropriate) and exam findings with patient/family. I have reviewed nursing notes and appropriate previous records.  I feel the patient is safe to be discharged home without further emergent workup and can continue workup as an outpatient as needed. Discussed usual and customary return precautions. Patient/family verbalize understanding and are comfortable with this plan.  Outpatient follow-up has been provided as needed. All  questions have been answered.      Meleana Commerford, Delice Bison, DO 04/20/19 [phone]

## 2019-11-14 IMAGING — CT CT CERVICAL SPINE WITHOUT CONTRAST
4 of 8 series · 12 of 33 positions shown, 14 images · non-contrast
Comparison: 04/08/2007

CLINICAL DATA: Fall from bed, head injury, right neck and occipital
pain

EXAM:
CT HEAD WITHOUT CONTRAST
CT CERVICAL SPINE WITHOUT CONTRAST
TECHNIQUE: Multidetector CT imaging of the head and cervical spine was
performed following the standard protocol without intravenous
contrast. Multiplanar CT image reconstructions of the cervical spine
were also generated.

[Series 9: c_spine 2.0 i30s 3 · axial · 0.30mm/px · z∈[-293,-213]mm · 3 of 81 slices shown]
[im 21/81  bone]
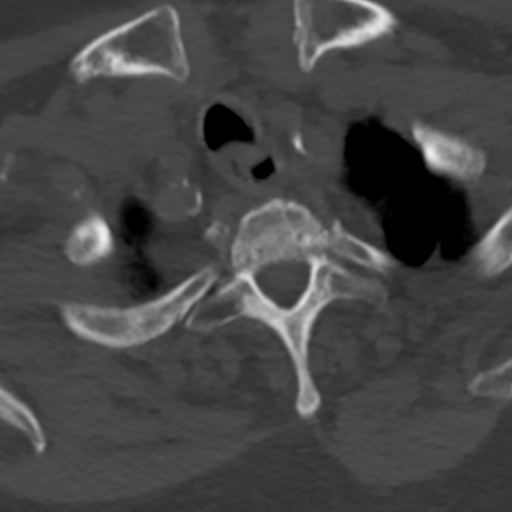
[im 41/81  bone]
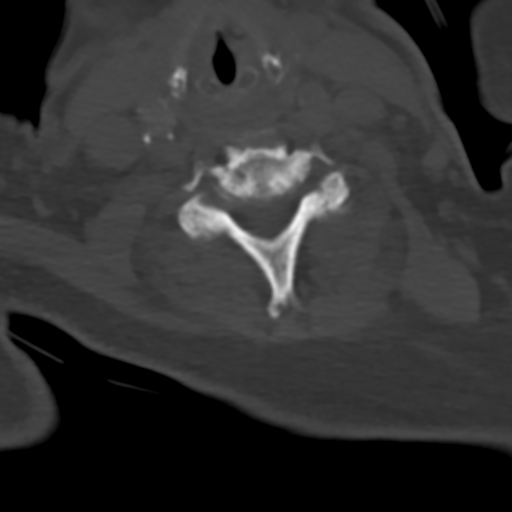
[im 61/81  bone]
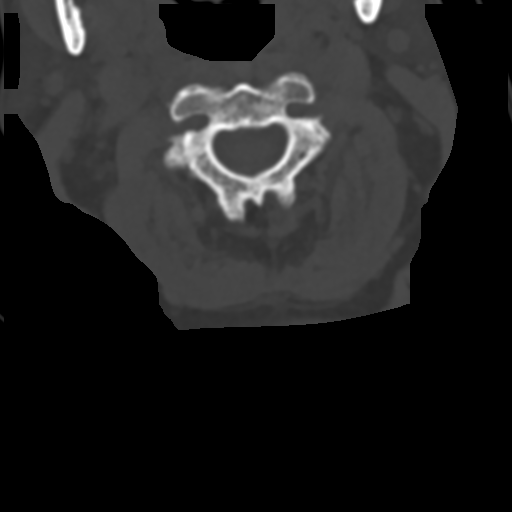

[Series 11: coronal c sp · coronal · 0.23mm/px · 1 of 61 slices shown]
[im 31/61  bone]
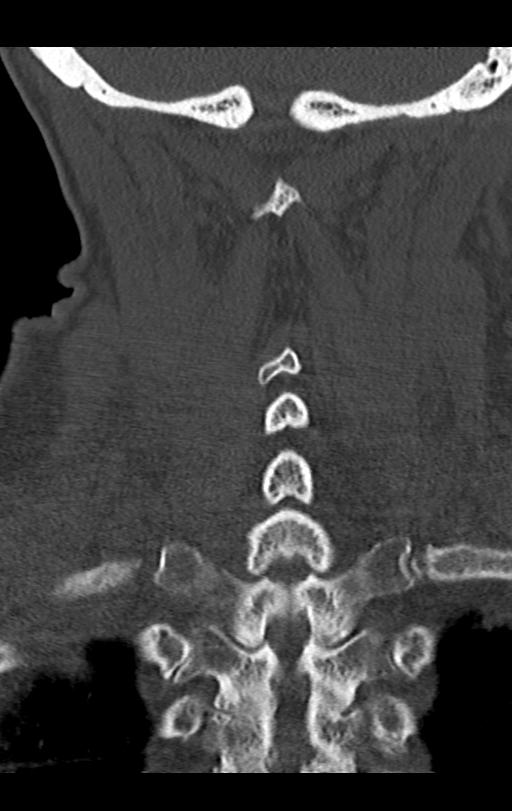

[Series 13: orthogonals · axial · 0.27mm/px · z∈[-333,-211]mm · 5 of 98 slices shown, 7 images]
[im 17/98  soft-tissue]
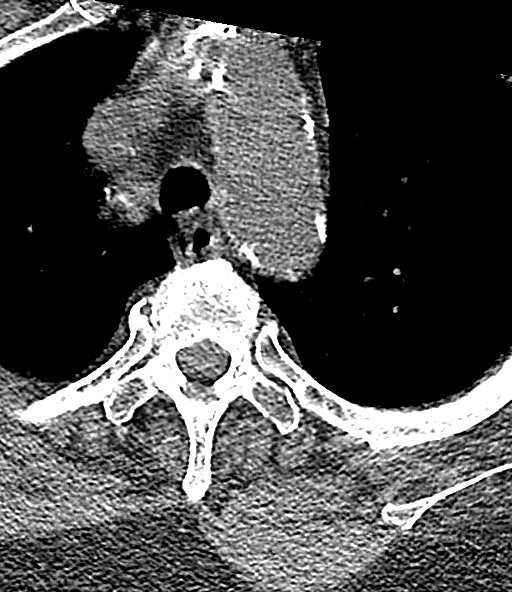
[im 17/98  bone]
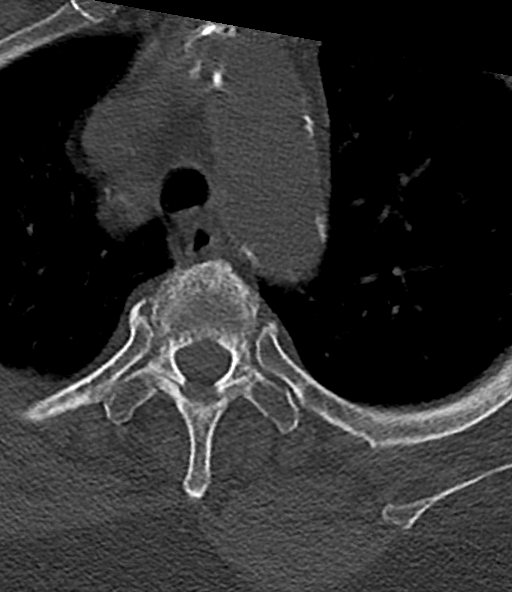
[im 33/98  bone]
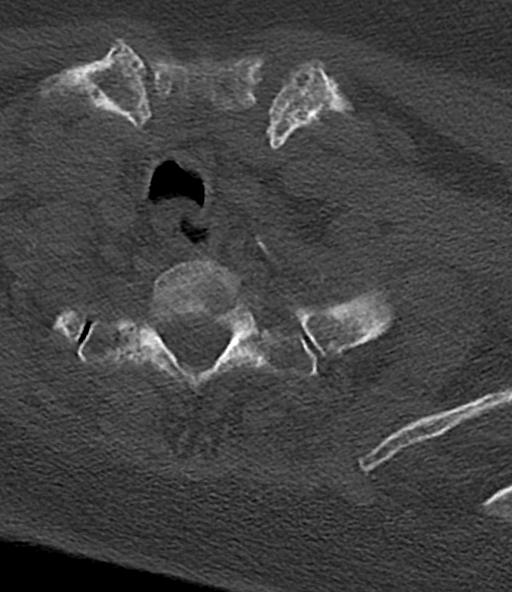
[im 49/98  bone]
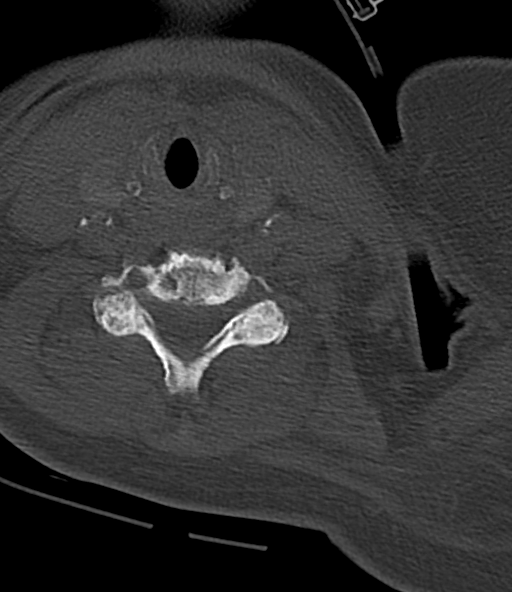
[im 65/98  bone]
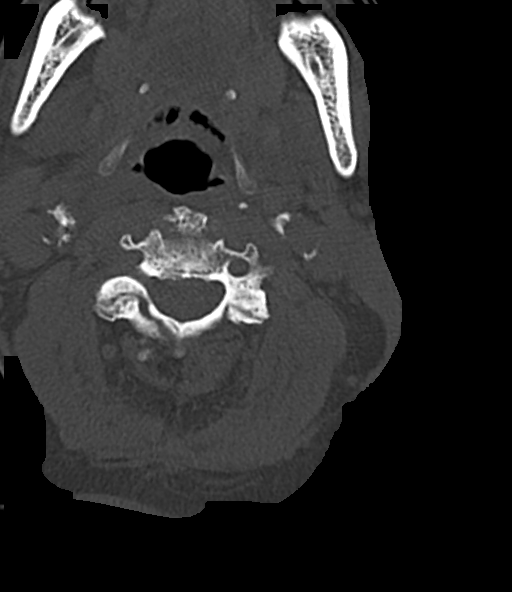
[im 81/98  soft-tissue]
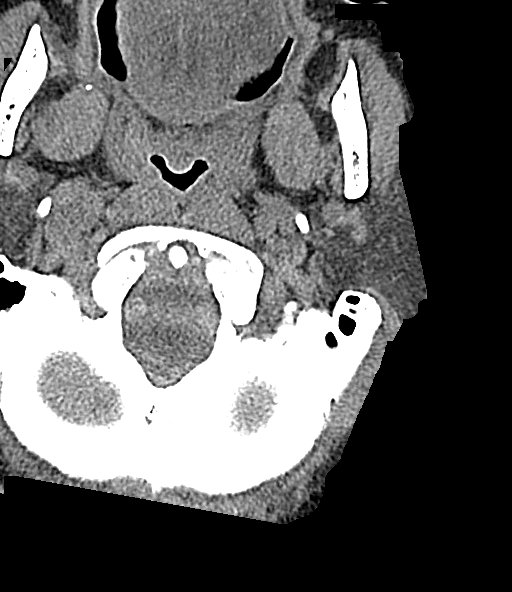
[im 81/98  bone]
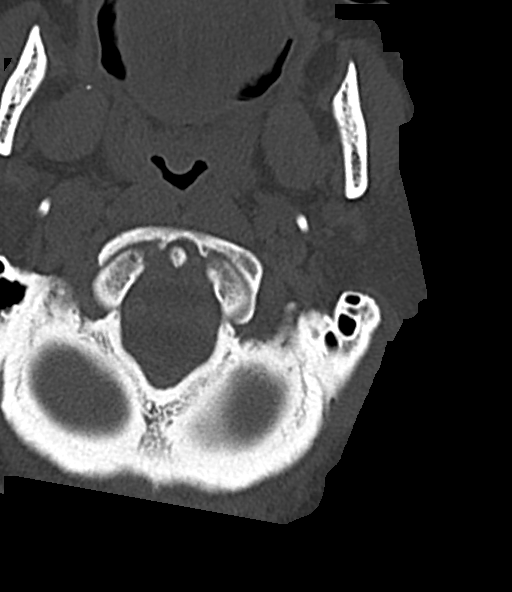

[Series 14: sagittal c-sp · sagittal · 0.23mm/px · 3 of 43 slices shown]
[im 11/43  bone]
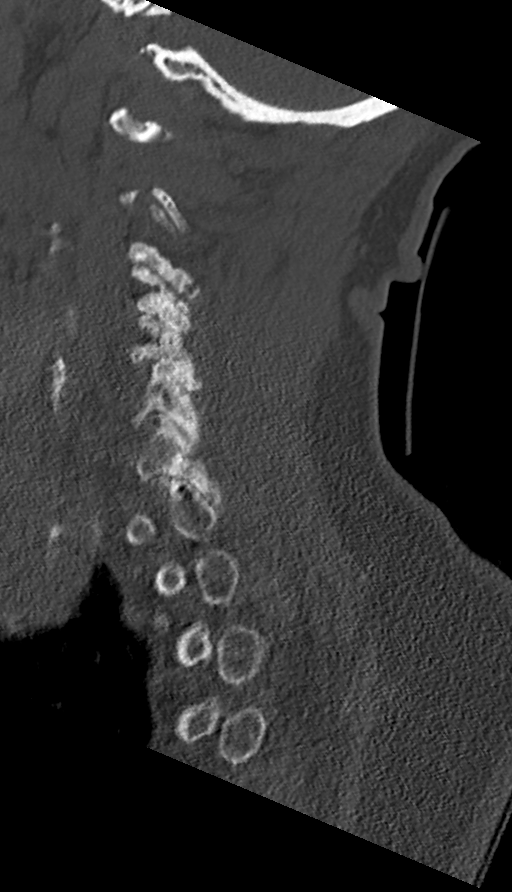
[im 22/43  bone]
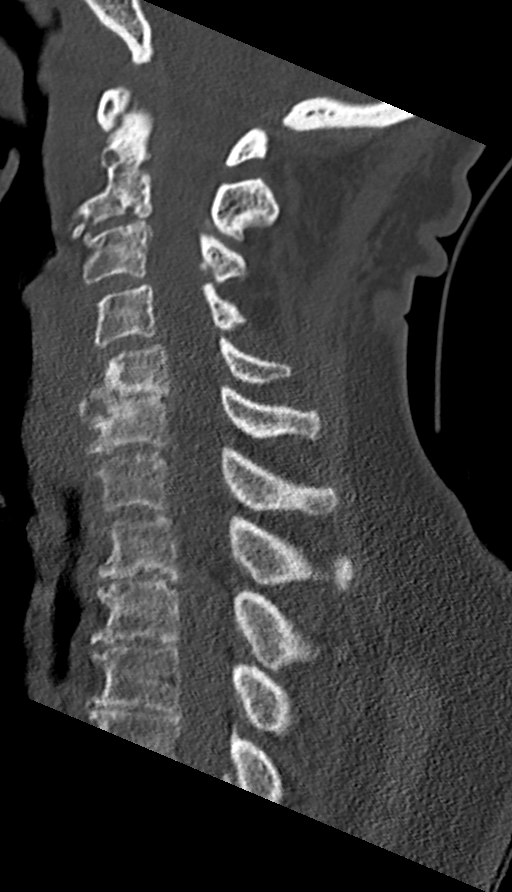
[im 32/43  bone]
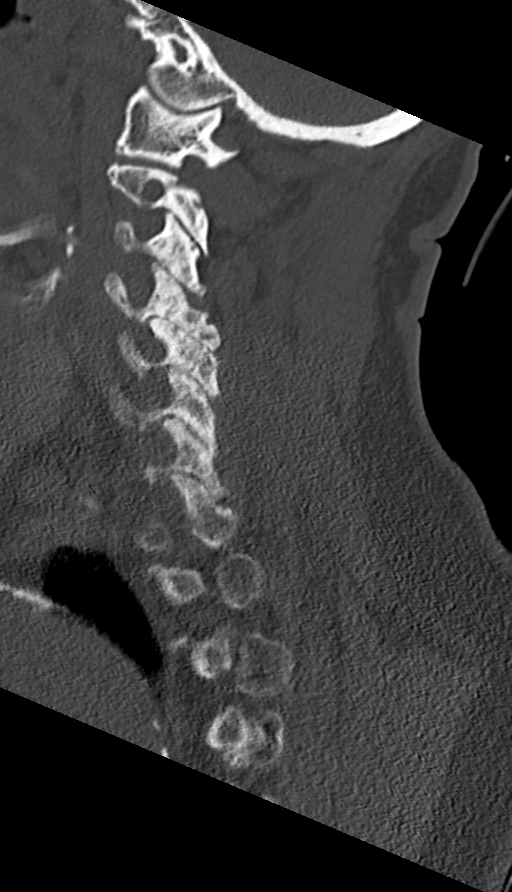

[12 of 33 positions shown; findings below may reference images not displayed]

FINDINGS: CT HEAD FINDINGS

Brain: No evidence of acute infarction, hemorrhage, hydrocephalus,
extra-axial collection or mass lesion/mass effect.

Global cortical atrophy.

Subcortical white matter and periventricular small vessel ischemic
changes.

Vascular: Intracranial atherosclerosis.

Skull: Normal. Negative for fracture or focal lesion.

Sinuses/Orbits: No acute finding.

Other: None.

CT CERVICAL SPINE FINDINGS

Alignment: Straightening of the cervical spine.

Skull base and vertebrae: No acute fracture. No primary bone lesion
or focal pathologic process.

Soft tissues and spinal canal: No prevertebral fluid or swelling. No
visible canal hematoma.

Disc levels: Mild to moderate multilevel degenerative changes.
Spinal canal is patent.

Upper chest: Visualized lung apices are clear.

Other: Visualized thyroid is unremarkable.
IMPRESSION: No evidence of acute intracranial abnormality. Atrophy with small
vessel ischemic changes.

No evidence of traumatic injury to the cervical spine. Mild to
moderate multilevel degenerative changes.

## 2022-04-14 ENCOUNTER — Encounter (HOSPITAL_BASED_OUTPATIENT_CLINIC_OR_DEPARTMENT_OTHER): Payer: Self-pay | Admitting: Pediatrics

## 2022-04-14 ENCOUNTER — Emergency Department (HOSPITAL_BASED_OUTPATIENT_CLINIC_OR_DEPARTMENT_OTHER)
Admission: EM | Admit: 2022-04-14 | Discharge: 2022-04-14 | Disposition: A | Discharge status: Home / Self Care | Payer: Medicare Other | Attending: Emergency Medicine | Admitting: Emergency Medicine

## 2022-04-14 ENCOUNTER — Other Ambulatory Visit: Payer: Self-pay

## 2022-04-14 DIAGNOSIS — E876 Hypokalemia: Secondary | ICD-10-CM | POA: Insufficient documentation

## 2022-04-14 DIAGNOSIS — Z7982 Long term (current) use of aspirin: Secondary | ICD-10-CM | POA: Diagnosis not present

## 2022-04-14 DIAGNOSIS — R531 Weakness: Secondary | ICD-10-CM | POA: Insufficient documentation

## 2022-04-14 DIAGNOSIS — R197 Diarrhea, unspecified: Secondary | ICD-10-CM | POA: Insufficient documentation

## 2022-04-14 DIAGNOSIS — R5383 Other fatigue: Secondary | ICD-10-CM | POA: Insufficient documentation

## 2022-04-14 LAB — LIPASE, BLOOD: Lipase: 24 U/L (ref 11–51)

## 2022-04-14 LAB — COMPREHENSIVE METABOLIC PANEL
ALT: 11 U/L (ref 0–44)
AST: 18 U/L (ref 15–41)
Albumin: 3.2 g/dL — ABNORMAL LOW (ref 3.5–5.0)
Alkaline Phosphatase: 53 U/L (ref 38–126)
Anion gap: 6 (ref 5–15)
BUN: 10 mg/dL (ref 8–23)
CO2: 23 mmol/L (ref 22–32)
Calcium: 8.5 mg/dL — ABNORMAL LOW (ref 8.9–10.3)
Chloride: 108 mmol/L (ref 98–111)
Creatinine, Ser: 0.63 mg/dL (ref 0.44–1.00)
GFR, Estimated: >60 mL/min (ref >60)
Glucose, Bld: 95 mg/dL (ref 70–99)
Potassium: 3.2 mmol/L — ABNORMAL LOW (ref 3.5–5.1)
Sodium: 137 mmol/L (ref 135–145)
Total Bilirubin: 0.7 mg/dL (ref 0.3–1.2)
Total Protein: 6.6 g/dL (ref 6.5–8.1)

## 2022-04-14 LAB — CBC
HCT: 41.7 % (ref 36.0–46.0)
Hemoglobin: 13.9 g/dL (ref 12.0–15.0)
MCH: 28 pg (ref 26.0–34.0)
MCHC: 33.3 g/dL (ref 30.0–36.0)
MCV: 84.1 fL (ref 80.0–100.0)
Platelets: 248 10*3/uL (ref 150–400)
RBC: 4.96 MIL/uL (ref 3.87–5.11)
RDW: 15.5 % (ref 11.5–15.5)
WBC: 10.4 10*3/uL (ref 4.0–10.5)
nRBC: 0 % (ref 0.0–0.2)

## 2022-04-14 LAB — OCCULT BLOOD X 1 CARD TO LAB, STOOL: Fecal Occult Bld: NEGATIVE

## 2022-04-14 LAB — MAGNESIUM: Magnesium: 1.8 mg/dL (ref 1.7–2.4)

## 2022-04-14 MED ORDER — POTASSIUM CHLORIDE CRYS ER 20 MEQ PO TBCR
40.0000 meq | EXTENDED_RELEASE_TABLET | Freq: Once | ORAL | Status: AC
  Administered 2022-04-14: 40 meq via ORAL
  Filled 2022-04-14: qty 2

## 2022-04-14 NOTE — ED Notes (Signed)
Pt aware of urine sample. Applied pt on bedpan with no success of voiding.

## 2022-04-14 NOTE — ED Provider Notes (Signed)
[facility] EMERGENCY DEPARTMENT Provider Note   CSN: [id number] Arrival date & time: 04/14/22  1104     History  Chief Complaint  Patient presents with   Weakness   Diarrhea    Jill [name] is a 88 y.o. female.  Patient presents to ER chief complaint of generalized weakness and fatigue and diarrhea.  She states she had loose stools yesterday and loose stools this morning.  She thought it looked bloody today.  Denies any headache or chest pain or abdominal pain.  No reports of fevers or cough or vomiting.  She states she uses a walker at baseline and denies any falls.      Home Medications Prior to Admission medications   Medication Sig Start Date End Date Taking? Authorizing Provider  acetaminophen (TYLENOL) 325 MG tablet Take 2 tablets (650 mg total) by mouth every 6 (six) hours as needed. Do not take more than '4000mg'$  of tylenol per day 02/18/18   Couture, Cortni S, PA-C  albuterol (PROVENTIL HFA;VENTOLIN HFA) 108 (90 Base) MCG/ACT inhaler Inhale 2 puffs into the lungs every 4 (four) hours as needed for wheezing or shortness of breath. 08/20/16   Focht, Fraser Din, PA  amLODipine (NORVASC) 10 MG tablet Take 10 mg by mouth daily.    [provider]  aspirin buffered ([employer] BUFFERED ASPIRIN) 325 MG TABS tablet Take 325 mg by mouth daily.     [provider]  benzonatate (TESSALON) 100 MG capsule Take 1 capsule (100 mg total) by mouth every 8 (eight) hours. Patient not taking: Reported on 02/18/2018 08/20/16   Kalman Drape, PA  Cetirizine HCl 10 MG CAPS Take 10 mg by mouth daily.  06/19/11   [provider]  Cholecalciferol (VITAMIN D-1000 MAX ST) 1000 units tablet Take 1,000 Units by mouth daily.     [provider]  Flaxseed, Linseed, (FLAXSEED OIL MAX STR) 1300 MG CAPS Take 1,300 mg by mouth daily.     [provider]  gabapentin (NEURONTIN) 300 MG capsule Take 300 mg by mouth 2 (two) times daily.  02/23/16   [provider]  hydroxypropyl methylcellulose / hypromellose (ISOPTO TEARS / GONIOVISC) 2.5 % ophthalmic solution Place 1 drop into both eyes at bedtime.     [provider]  ibuprofen (ADVIL,MOTRIN) 200 MG tablet Take 600 mg by mouth every 6 (six) hours as needed for moderate pain.    [provider]  losartan (COZAAR) 100 MG tablet Take 100 mg by mouth daily.    [provider]  Multiple Vitamin (MULTIVITAMIN WITH MINERALS) TABS tablet Take 1 tablet by mouth daily.    [provider]  ondansetron (ZOFRAN) 4 MG tablet Take 1 tablet (4 mg total) by mouth every 8 (eight) hours as needed for nausea or vomiting. 02/18/18   Couture, Cortni S, PA-C  OVER THE COUNTER MEDICATION Take 1 capsule by mouth daily. Lung Supplement    [provider]  OVER THE COUNTER MEDICATION Take 1 capsule by mouth daily. Flex Support Supplement    [provider]  vitamin C (ASCORBIC ACID) 500 MG tablet Take 1,000 mg by mouth daily.     [provider]      Allergies    Morphine    Review of Systems   Review of Systems  Constitutional:  Negative for fever.  HENT:  Negative for ear pain.   Eyes:  Negative for pain.  Respiratory:  Negative for cough.   Cardiovascular:  Negative  for chest pain.  Gastrointestinal:  Negative for abdominal pain.  Genitourinary:  Negative for flank pain.  Musculoskeletal:  Negative for back pain.  Skin:  Negative for rash.  Neurological:  Negative for headaches.   Physical Exam Updated Vital Signs BP (!) 176/54 (BP Location: Right Arm)   Pulse 86   Temp 98.5 F (36.9 C) (Oral)   Resp 20   Ht '4\' 9"'$  (1.448 m)   Wt 67.1 kg   SpO2 98%   BMI 32.03 kg/m  Physical Exam Constitutional:      General: She is not in acute distress.    Appearance: Normal appearance.  HENT:     Head: Normocephalic.     Nose: Nose normal.  Eyes:     Extraocular Movements: Extraocular movements intact.  Cardiovascular:     Rate and  Rhythm: Normal rate.  Pulmonary:     Effort: Pulmonary effort is normal.  Musculoskeletal:        General: Normal range of motion.     Cervical back: Normal range of motion.  Neurological:     General: No focal deficit present.     Mental Status: She is alert and oriented to person, place, and time. Mental status is at baseline.     Cranial Nerves: No cranial nerve deficit.     Motor: No weakness.     Comments: 5/5 strength all extremities.    ED Results / Procedures / Treatments   Labs (all labs ordered are listed, but only abnormal results are displayed) Labs Reviewed  COMPREHENSIVE METABOLIC PANEL - Abnormal; Notable for the following components:      Result Value   Potassium 3.2 (*)    Calcium 8.5 (*)    Albumin 3.2 (*)    All other components within normal limits  CBC  OCCULT BLOOD X 1 CARD TO LAB, STOOL  LIPASE, BLOOD  MAGNESIUM    EKG None  Radiology No results found.  Procedures Procedures    Medications Ordered in ED Medications  potassium chloride SA (KLOR-CON M) CR tablet 40 mEq (has no administration in time range)    ED Course/ Medical Decision Making/ A&P                           Medical Decision Making Amount and/or Complexity of Data Reviewed Labs: ordered.   Chart review shows outpatient visit November 03, 2020 for lumbar stenosis.  Cardiac monitoring showing sinus rhythm.  Diagnostic labs today white count 10 hemoglobin 14 chemistry unremarkable sodium normal potassium slightly low at 3.2 given repletion.  Rectal exam is benign with no bloody stool noted brown in color occult stool test is negative.  Recommending outpatient follow-up with her doctor this week.  Recommending immediate return if fevers or vomiting or pain or any additional concerns, otherwise discharged home in stable condition.        Final Clinical Impression(s) / ED Diagnoses Final diagnoses:  Diarrhea, unspecified type    Rx / DC Orders ED Discharge  Orders     None         Luna Fuse, MD 04/14/22 [phone]

## 2022-04-14 NOTE — ED Triage Notes (Signed)
Arrived via [facility]; c/o diarrhea x 3 days and progressing weakness;

## 2022-04-14 NOTE — ED Notes (Signed)
Pt placed back on bedpan to try to void for sample.

## 2022-04-14 NOTE — ED Notes (Signed)
Pt unable to void at this time. 

## 2022-04-14 NOTE — Discharge Instructions (Signed)
Call your primary care doctor or specialist as discussed in the next 2-3 days.   Return immediately back to the ER if:  Your symptoms worsen within the next 12-24 hours. You develop new symptoms such as new fevers, persistent vomiting, new pain, shortness of breath, or new weakness or numbness, or if you have any other concerns.  

## 2022-04-14 NOTE — ED Notes (Signed)
[phone] called regarding patient's dispo; no answer,
